# Patient Record
Sex: Male | Born: 1970 | Race: White | Hispanic: No | Marital: Married | Smoking: Never smoker
Health system: Southern US, Community
[De-identification: ages and names within clinical notes are randomized; demographics above are authoritative.]

## PROBLEM LIST (undated history)

## (undated) DIAGNOSIS — J45909 Unspecified asthma, uncomplicated: Secondary | ICD-10-CM

---

## 2003-10-23 ENCOUNTER — Emergency Department (HOSPITAL_COMMUNITY): Admission: EM | Admit: 2003-10-23 | Discharge: 2003-10-23 | Payer: Self-pay

## 2004-03-16 ENCOUNTER — Emergency Department (HOSPITAL_COMMUNITY): Admission: EM | Admit: 2004-03-16 | Discharge: 2004-03-17 | Payer: Self-pay | Admitting: Emergency Medicine

## 2006-07-28 IMAGING — CR DG ABDOMEN ACUTE W/ 1V CHEST
4 series · 4 of 4 positions shown · non-contrast
Comparison: none

CLINICAL DATA: Fall with left rib pain and abdominal pain. 
 ACUTE ABDOMINAL SERIES WITH CHEST - 3 VIEW:

[view not recorded (1 of 4)]
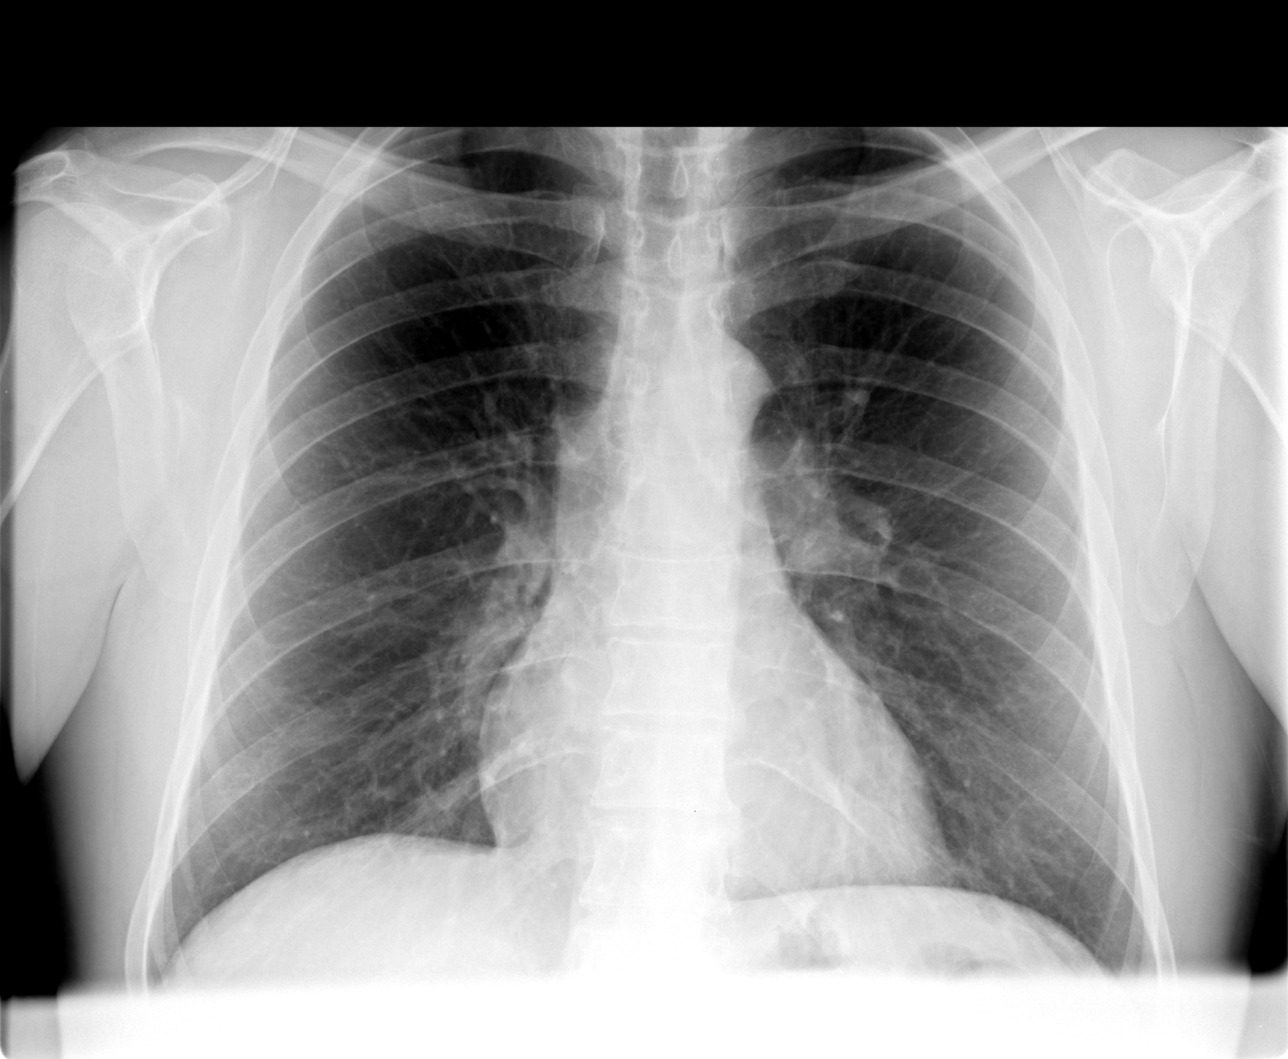

[view not recorded (2 of 4)]
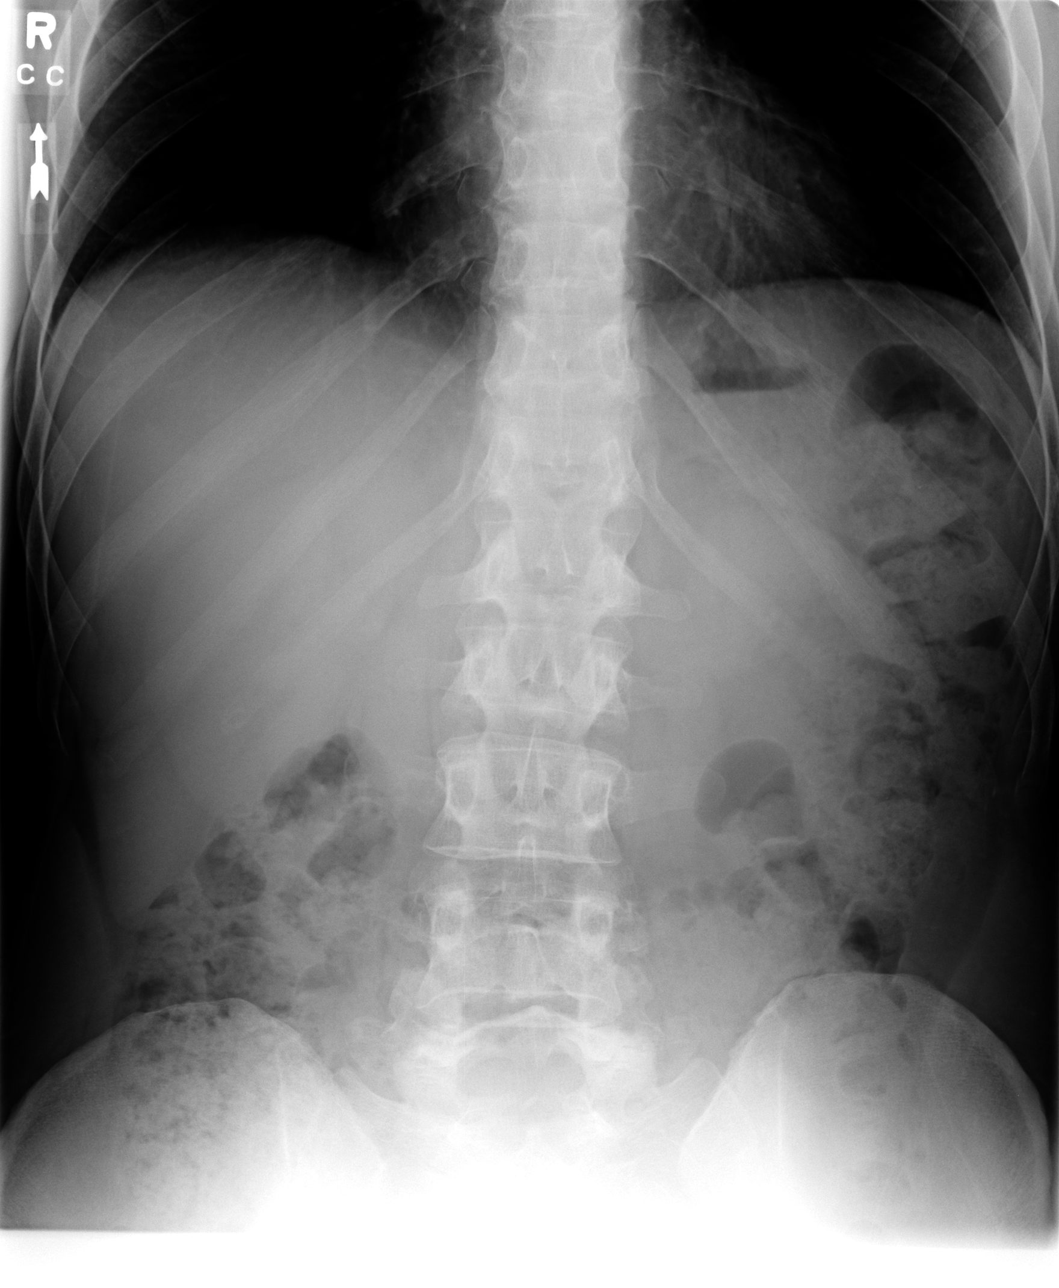

[view not recorded (3 of 4)]
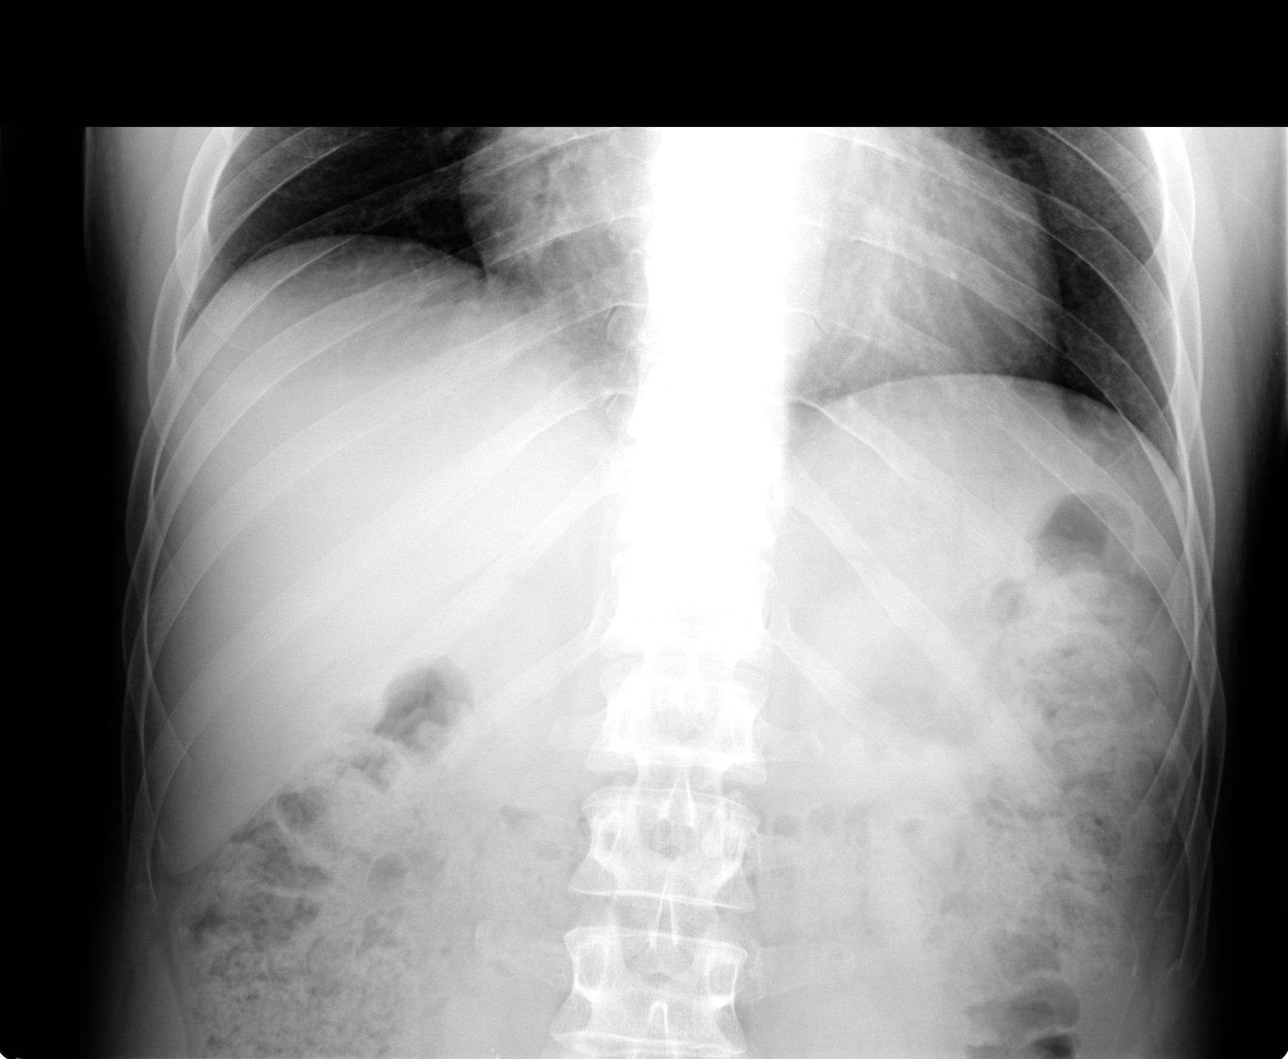

[view not recorded (4 of 4)]
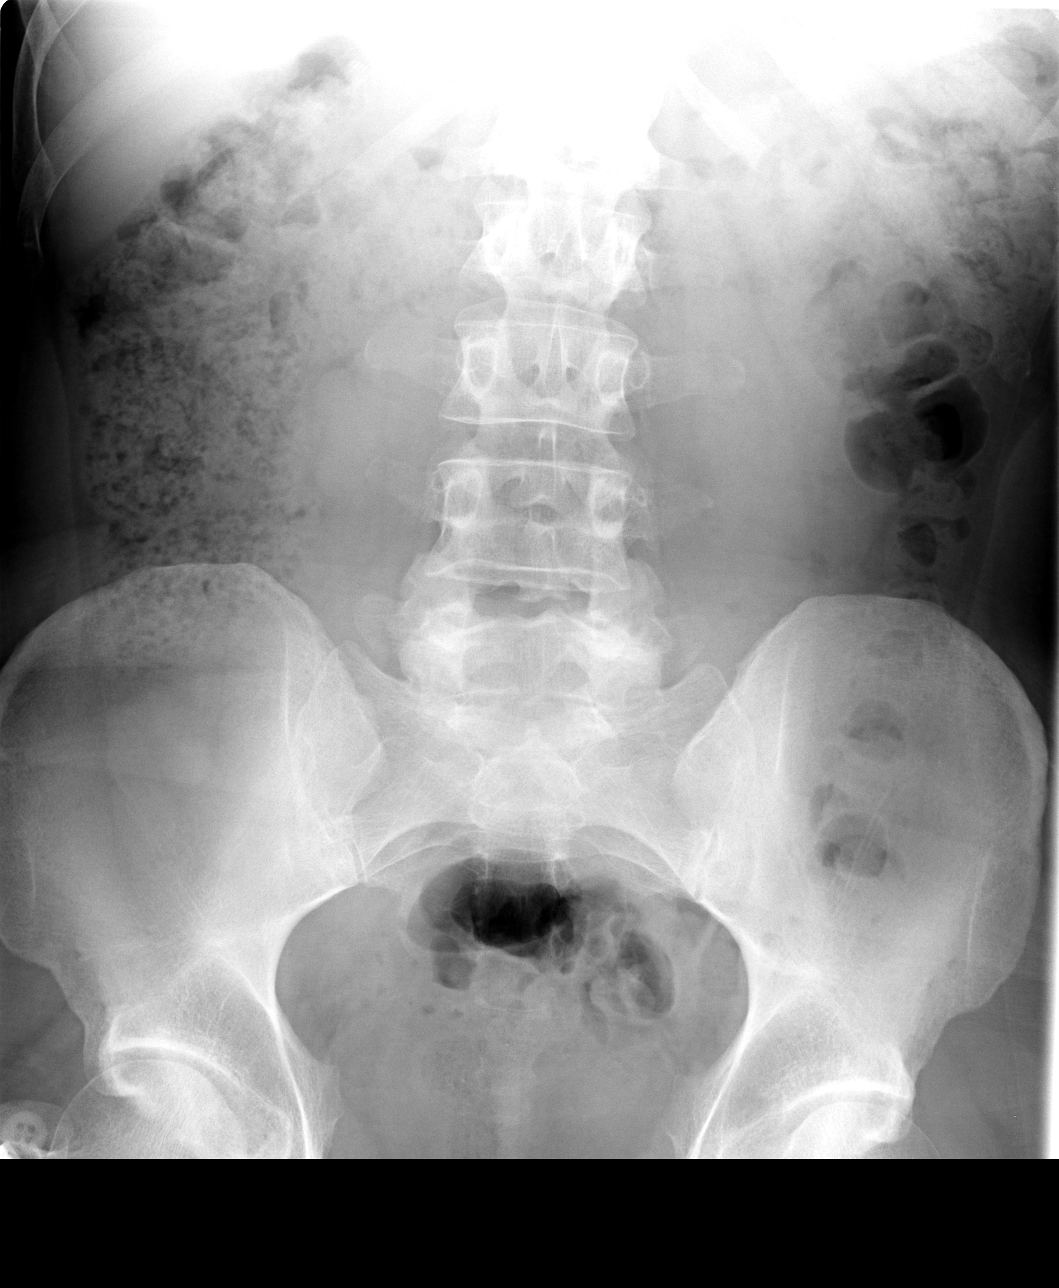

[4 of 4 positions shown; findings below may reference images not displayed]

FINDINGS: Cardiomediastinal silhouette is unremarkable.  The lungs are clear.  The bony thorax is unremarkable.  No evidence of pleural effusions or pneumothorax.   There is a fracture of the anterior left 10th rib.   Stool in the colon is noted.  Unremarkable bowel gas pattern is present.
IMPRESSION: 1.  No acute abnormality. 
 2.  Moderate stool in the colon.

## 2006-07-28 IMAGING — CR DG RIBS 2V*L*
3 series · 3 of 3 positions shown · non-contrast
Comparison: none

CLINICAL DATA: 33-year-old.  Fell with left rib pain.
 LEFT RIBS ? 3 VIEW:
 There is a possible nondisplaced fracture involving the anterior left 10th rib.  No other rib abnormalities are seen.  No pneumothorax.  No pleural effusion or significant atelectasis.

[view not recorded (1 of 3)]
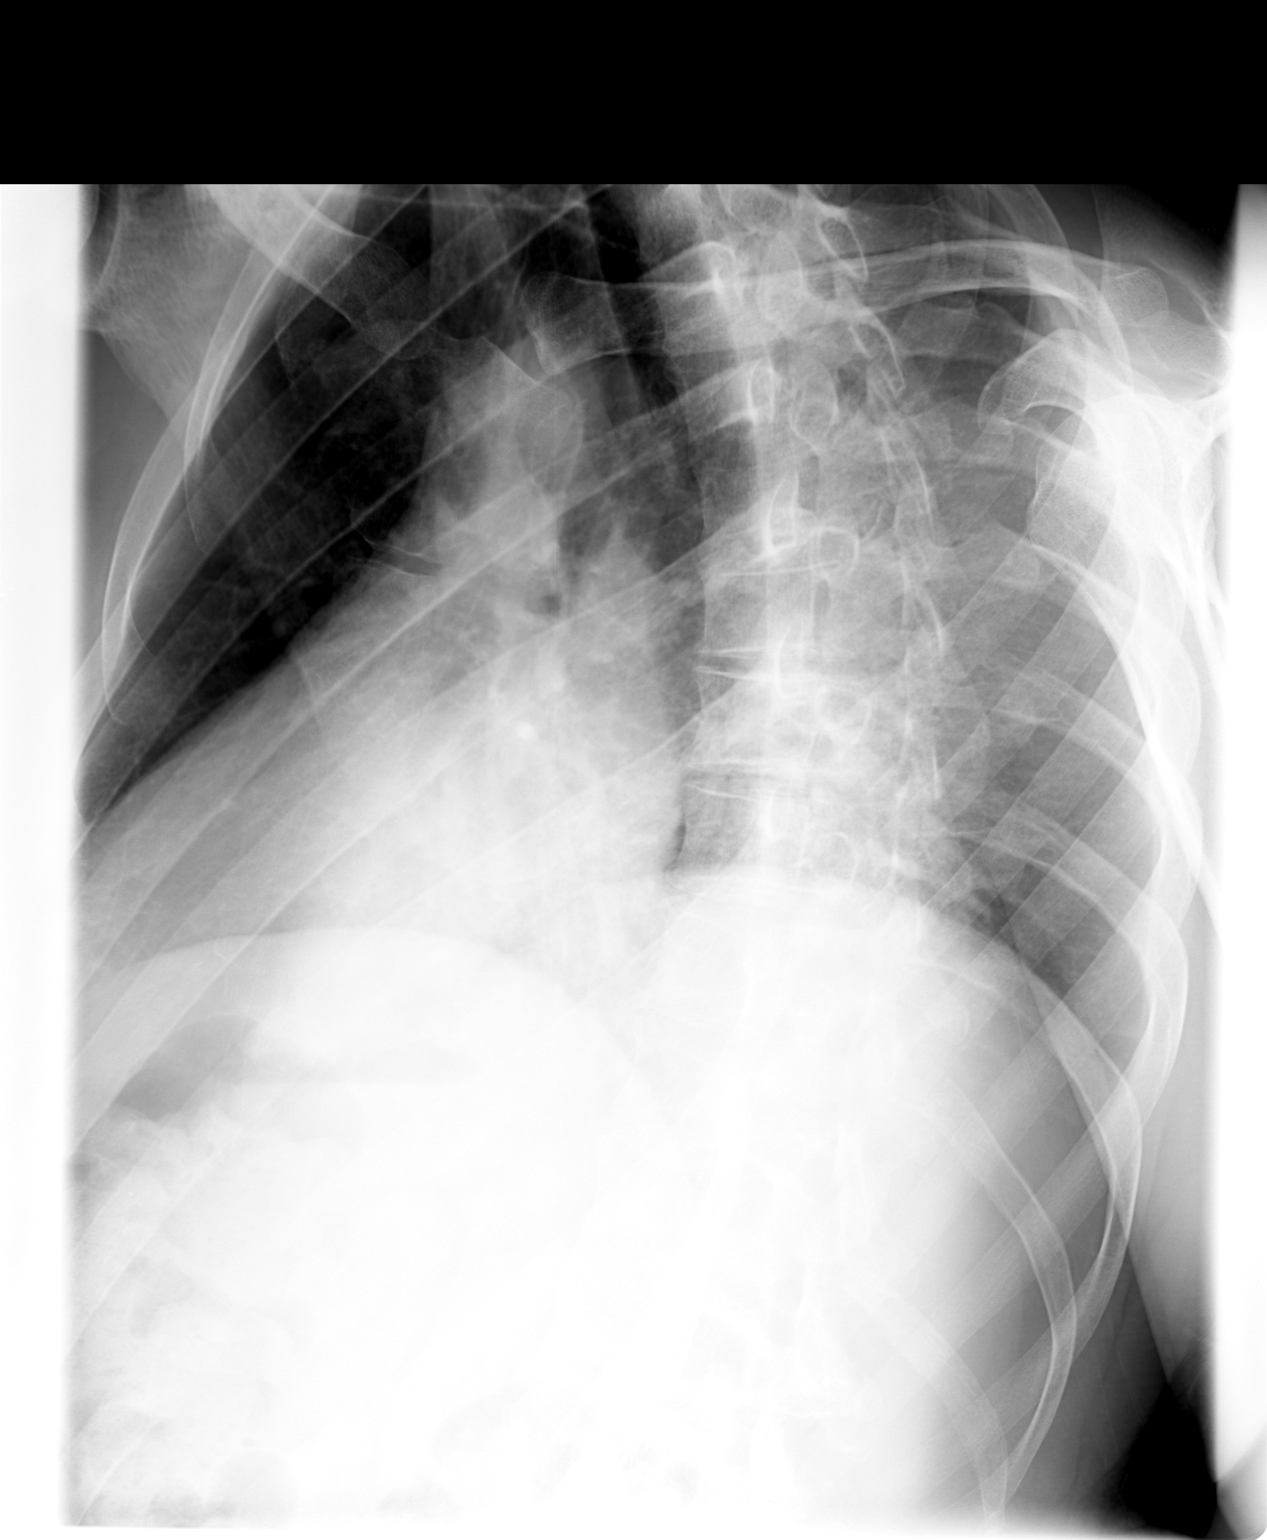

[view not recorded (2 of 3)]
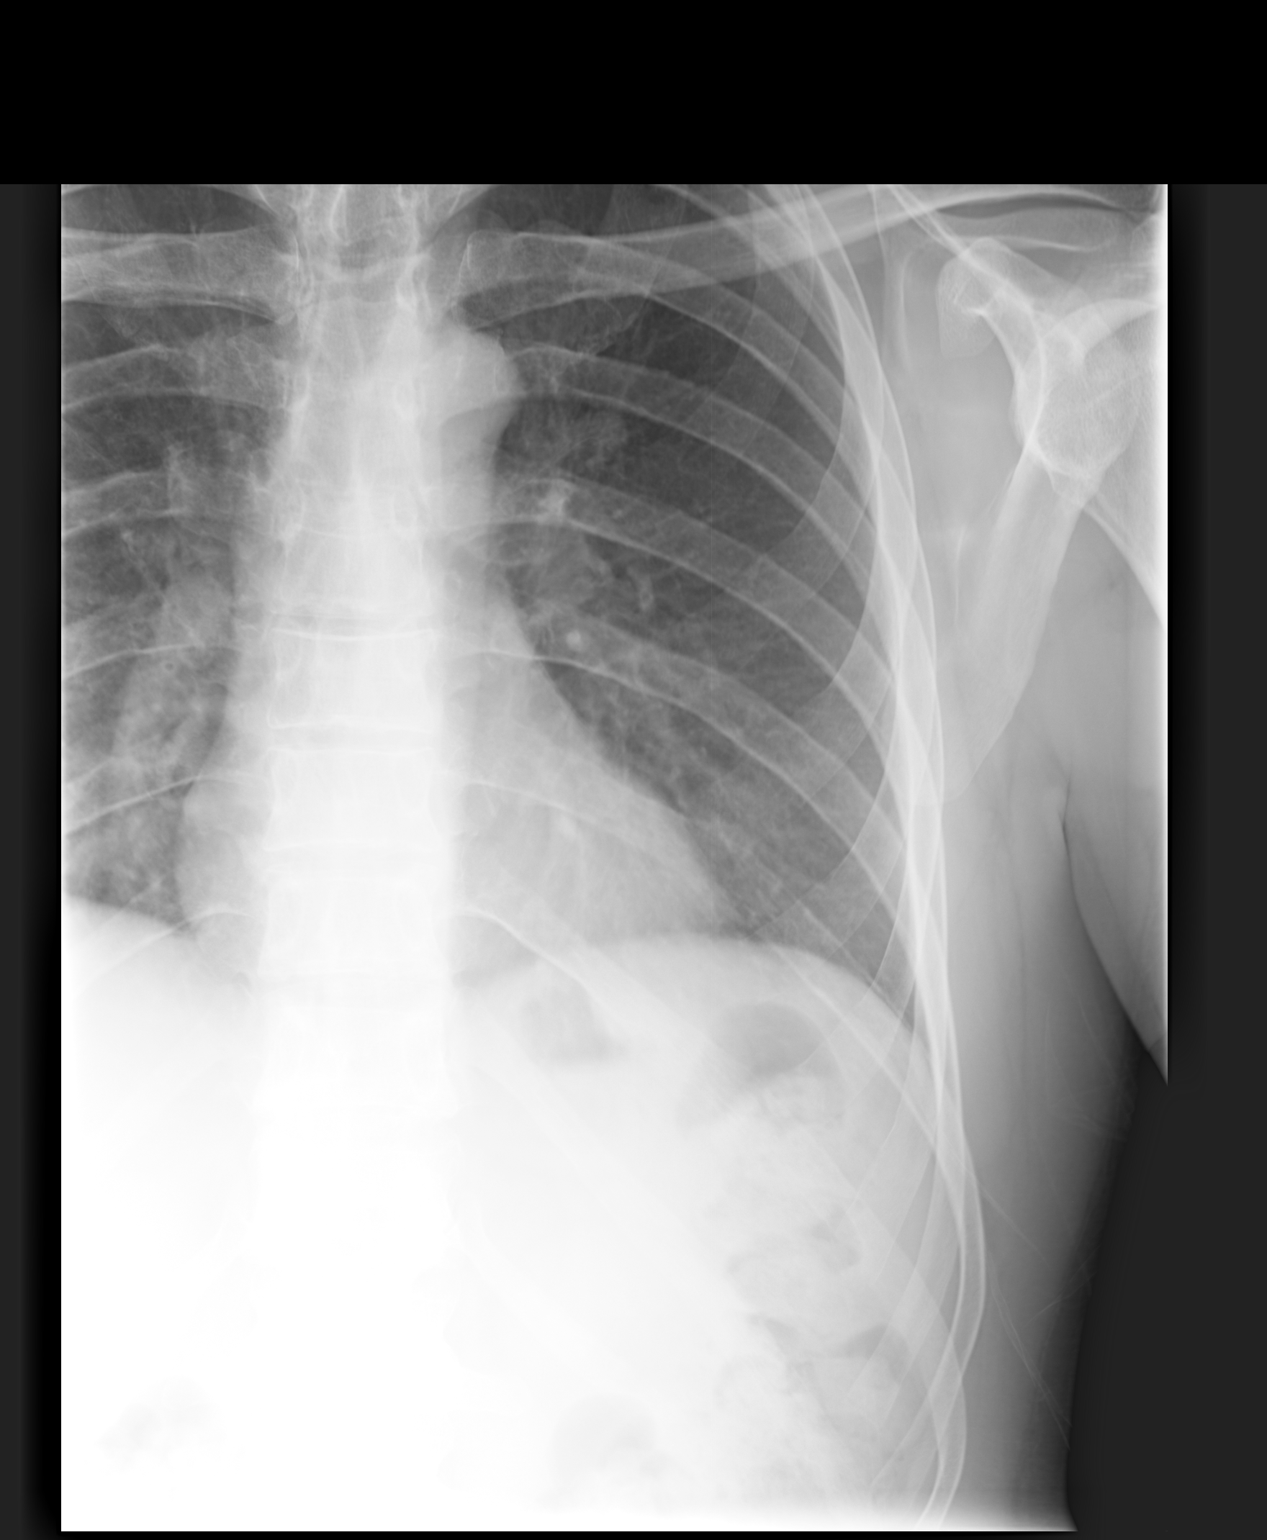

[view not recorded (3 of 3)]
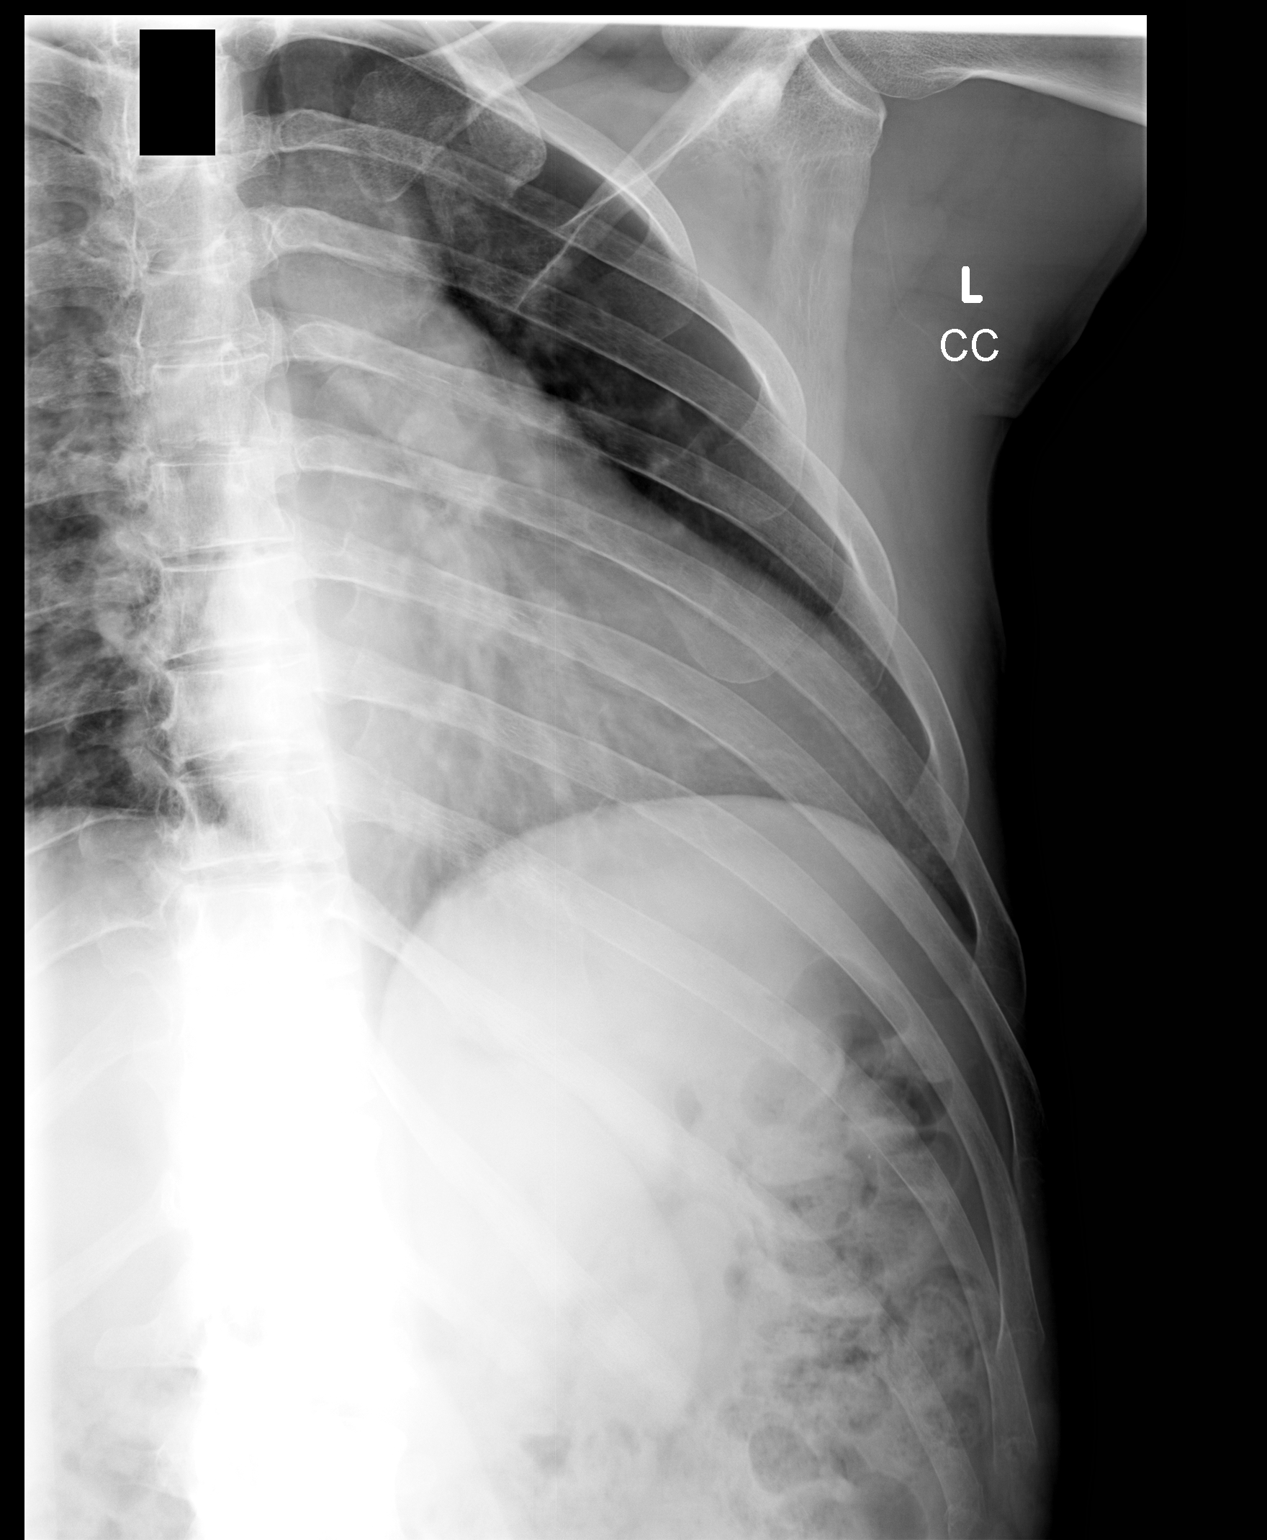

[3 of 3 positions shown; findings below may reference images not displayed]

IMPRESSION: Probable nondisplaced fracture involving the tenth anterior rib.

## 2011-10-13 ENCOUNTER — Ambulatory Visit: Payer: Medicare HMO

## 2011-10-13 ENCOUNTER — Ambulatory Visit (INDEPENDENT_AMBULATORY_CARE_PROVIDER_SITE_OTHER): Payer: Medicare HMO | Admitting: Physician Assistant

## 2011-10-13 VITALS — BP 150/96 | HR 113 | Temp 98.0°F | Resp 18 | Ht 72.5 in | Wt 259.0 lb

## 2011-10-13 DIAGNOSIS — J45909 Unspecified asthma, uncomplicated: Secondary | ICD-10-CM

## 2011-10-13 DIAGNOSIS — R059 Cough, unspecified: Secondary | ICD-10-CM

## 2011-10-13 DIAGNOSIS — R0603 Acute respiratory distress: Secondary | ICD-10-CM

## 2011-10-13 DIAGNOSIS — J189 Pneumonia, unspecified organism: Secondary | ICD-10-CM

## 2011-10-13 DIAGNOSIS — Z131 Encounter for screening for diabetes mellitus: Secondary | ICD-10-CM

## 2011-10-13 DIAGNOSIS — R0609 Other forms of dyspnea: Secondary | ICD-10-CM

## 2011-10-13 DIAGNOSIS — R0989 Other specified symptoms and signs involving the circulatory and respiratory systems: Secondary | ICD-10-CM

## 2011-10-13 DIAGNOSIS — J9801 Acute bronchospasm: Secondary | ICD-10-CM

## 2011-10-13 DIAGNOSIS — R05 Cough: Secondary | ICD-10-CM

## 2011-10-13 LAB — POCT CBC
Granulocyte percent: 80.3 %G — AB (ref 37–80)
HCT, POC: 51.4 % (ref 43.5–53.7)
Lymph, poc: 2 (ref 0.6–3.4)
MCH, POC: 29.5 pg (ref 27–31.2)
MCHC: 32.5 g/dL (ref 31.8–35.4)
MCV: 90.7 fL (ref 80–97)
POC LYMPH PERCENT: 14.5 %L (ref 10–50)
RDW, POC: 13.7 %
WBC: 13.9 10*3/uL — AB (ref 4.6–10.2)

## 2011-10-13 LAB — GLUCOSE, POCT (MANUAL RESULT ENTRY): POC Glucose: 82 mg/dl (ref 70–99)

## 2011-10-13 MED ORDER — FLUTICASONE-SALMETEROL 250-50 MCG/DOSE IN AEPB: 1.0000 | INHALATION_SPRAY | Freq: Two times a day (BID) | RESPIRATORY_TRACT | Status: AC

## 2011-10-13 MED ORDER — CEFTRIAXONE SODIUM 1 G IJ SOLR
1.0000 g | Freq: Once | INTRAMUSCULAR | Status: AC
  Administered 2011-10-13: 1 g via INTRAMUSCULAR

## 2011-10-13 MED ORDER — AZITHROMYCIN 500 MG PO TABS: 500.0000 mg | ORAL_TABLET | Freq: Every day | ORAL | Status: AC

## 2011-10-13 MED ORDER — PREDNISONE 10 MG PO TABS
ORAL_TABLET | ORAL | Status: AC
Start: 2011-10-13 — End: ?

## 2011-10-13 MED ORDER — ALBUTEROL SULFATE HFA 108 (90 BASE) MCG/ACT IN AERS: 2.0000 | INHALATION_SPRAY | Freq: Four times a day (QID) | RESPIRATORY_TRACT | Status: AC | PRN

## 2011-10-13 MED ORDER — METHYLPREDNISOLONE SODIUM SUCC 125 MG IJ SOLR
125.0000 mg | Freq: Once | INTRAMUSCULAR | Status: AC
  Administered 2011-10-13: 125 mg via INTRAVENOUS

## 2011-10-13 NOTE — Progress Notes (Signed)
  Subjective:    Patient ID: Luke Murray, male    DOB: [birth year], 41 y.o.   MRN: [id number]  HPI 41 yr old CM presents with sever SOB and chest tightness.  He was given priority over other patients and pulse ox was 92% on arrival.  2 L O2 initiated.  He states he has a h/o asthma, but he hasn't been taking meds for ~2years.  He started having allergy symptoms about 1 week ago and started OTC allergy meds.  His wheezing started acutely today when playing with his son inside the house and they had turned the air-conditioning off bc the weather had cooled down a little.  On and off for the last week, he has had sinus congestion.  He feels bad today.  PCP: Dr. Janace Litten  Review of Systems  All other systems reviewed and are negative.      Objective:   Physical Exam  Nursing note and vitals reviewed. Constitutional: He is oriented to person, place, and time. He appears well-developed and well-nourished. He appears distressed.       Patient SOB and gasping for air upon arrival.  Appeared pale.   HENT:  Head: Normocephalic and atraumatic.  Mouth/Throat: No oropharyngeal exudate.  Cardiovascular: Regular rhythm and normal heart sounds.        Increased rate with regular rhythm upon initial exam.  Patient was attended by myself and Dr. Perrin Maltese.  Pulmonary/Chest: He is in respiratory distress. He has wheezes (wheezes present throughout and poor air movement initially). He has rales (B bases).  Neurological: He is alert and oriented to person, place, and time.  Skin: Skin is warm and dry.  Psychiatric: He has a normal mood and affect. His behavior is normal.    Results for orders placed in visit on 10/13/11  GLUCOSE, POCT (MANUAL RESULT ENTRY)      Component Value Range   POC Glucose 82  70 - 99 mg/dl  POCT CBC      Component Value Range   WBC 13.9 (*) 4.6 - 10.2 K/uL   Lymph, poc 2.0  0.6 - 3.4   POC LYMPH PERCENT 14.5  10 - 50 %L   MID (cbc) 0.7  0 - 0.9   POC MID % 5.2  0 - 12 %M   POC  Granulocyte 11.2 (*) 2 - 6.9   Granulocyte percent 80.3 (*) 37 - 80 %G   RBC 5.67  4.69 - 6.13 M/uL   Hemoglobin 16.7  14.1 - 18.1 g/dL   HCT, POC 40.9  81.1 - 53.7 %   MCV 90.7  80 - 97 fL   MCH, POC 29.5  27 - 31.2 pg   MCHC 32.5  31.8 - 35.4 g/dL   RDW, POC 91.4     Platelet Count, POC 308  142 - 424 K/uL   MPV 9.6  0 - 99.8 fL   A Chest X-Ray was ordered. DR Guest's reading of this film is perihilar infiltrates. (No comparison films available: pending review by Radiologist.) Oxygen was initiated at 3:00pm-pulse Ox was 95% on 2L O2. Albuterol and atrovent initiated at 3:05 pm-Oxygen sat at 98% on O2 and with treatment Upon being ready to leave: Pulse ox at 96% on no O2 BP at 120/84, pulse 84    Assessment & Plan:  Respiratory distress-stabilized. Pneumonia-see meds bronchospasms/asthma

## 2011-10-13 NOTE — Patient Instructions (Signed)
Fluids, rest, and recheck in 36 hours, sooner if needed and to ER/call 911 if you develop breathing difficulties.

## 2016-10-18 ENCOUNTER — Encounter: Payer: Self-pay | Admitting: Emergency Medicine

## 2016-10-18 ENCOUNTER — Emergency Department
Admission: EM | Admit: 2016-10-18 | Discharge: 2016-10-19 | Disposition: A | Discharge status: Home / Self Care | Payer: Worker's Compensation | Attending: Emergency Medicine | Admitting: Emergency Medicine

## 2016-10-18 DIAGNOSIS — Y990 Civilian activity done for income or pay: Secondary | ICD-10-CM | POA: Diagnosis not present

## 2016-10-18 DIAGNOSIS — S59911A Unspecified injury of right forearm, initial encounter: Secondary | ICD-10-CM | POA: Diagnosis present

## 2016-10-18 DIAGNOSIS — J45909 Unspecified asthma, uncomplicated: Secondary | ICD-10-CM | POA: Diagnosis not present

## 2016-10-18 DIAGNOSIS — Y93F9 Activity, other caregiving: Secondary | ICD-10-CM | POA: Insufficient documentation

## 2016-10-18 DIAGNOSIS — Z7721 Contact with and (suspected) exposure to potentially hazardous body fluids: Secondary | ICD-10-CM | POA: Insufficient documentation

## 2016-10-18 DIAGNOSIS — Y92239 Unspecified place in hospital as the place of occurrence of the external cause: Secondary | ICD-10-CM | POA: Diagnosis not present

## 2016-10-18 DIAGNOSIS — W503XXA Accidental bite by another person, initial encounter: Secondary | ICD-10-CM

## 2016-10-18 DIAGNOSIS — Z79899 Other long term (current) drug therapy: Secondary | ICD-10-CM | POA: Insufficient documentation

## 2016-10-18 DIAGNOSIS — S51851A Open bite of right forearm, initial encounter: Secondary | ICD-10-CM | POA: Diagnosis not present

## 2016-10-18 HISTORY — DX: Unspecified asthma, uncomplicated: J45.909
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
## 2016-10-19 MED ORDER — DOLUTEGRAVIR SODIUM 50 MG PO TABS
50.0000 mg | ORAL_TABLET | Freq: Every day | ORAL | Status: DC
  Administered 2016-10-19: 50 mg via ORAL
  Filled 2016-10-19: qty 1

## 2016-10-19 MED ORDER — EMTRICITABINE-TENOFOVIR AF 200-25 MG PO TABS
1.0000 | ORAL_TABLET | Freq: Every day | ORAL | Status: DC
  Administered 2016-10-19: 1 via ORAL
  Filled 2016-10-19: qty 1

## 2016-10-19 MED ORDER — DOLUTEGRAVIR SODIUM 50 MG PO TABS: 50.0000 mg | ORAL_TABLET | Freq: Every day | ORAL | 0 refills | Status: AC

## 2016-10-19 MED ORDER — AMOXICILLIN-POT CLAVULANATE 875-125 MG PO TABS
1.0000 | ORAL_TABLET | Freq: Once | ORAL | Status: AC
  Administered 2016-10-19: 1 via ORAL
  Filled 2016-10-19: qty 1

## 2016-10-19 MED ORDER — AMOXICILLIN-POT CLAVULANATE 875-125 MG PO TABS: 1.0000 | ORAL_TABLET | Freq: Two times a day (BID) | ORAL | 0 refills | Status: AC

## 2016-10-19 MED ORDER — EMTRICITABINE-TENOFOVIR AF 200-25 MG PO TABS: 1.0000 | ORAL_TABLET | Freq: Every day | ORAL | 0 refills | Status: AC

## 2016-10-19 NOTE — ED Notes (Signed)
Pt verbalized understanding of discharge instructions, prescriptions and follow-up care. Pt given follow-up testing information and verbalized understanding that he can go to employee health for those items. Pt in NAD at this time. Skin warm and dry. A&O x4.
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
